# Patient Record
Sex: Female | Born: 1965 | Race: White | Hispanic: No | Marital: Single
Health system: Southern US, Community
[De-identification: ages and names within clinical notes are randomized; demographics above are authoritative.]

---

## 1999-05-28 ENCOUNTER — Emergency Department (HOSPITAL_COMMUNITY): Admission: EM | Admit: 1999-05-28 | Discharge: 1999-05-28 | Payer: Self-pay | Admitting: Emergency Medicine

## 2003-09-21 ENCOUNTER — Ambulatory Visit (HOSPITAL_COMMUNITY): Admission: RE | Admit: 2003-09-21 | Discharge: 2003-09-21 | Payer: Self-pay | Admitting: Internal Medicine

## 2005-07-18 ENCOUNTER — Ambulatory Visit (HOSPITAL_COMMUNITY): Admission: RE | Admit: 2005-07-18 | Discharge: 2005-07-18 | Payer: Self-pay | Admitting: Internal Medicine

## 2006-01-25 ENCOUNTER — Ambulatory Visit (HOSPITAL_COMMUNITY): Admission: RE | Admit: 2006-01-25 | Discharge: 2006-01-25 | Payer: Self-pay | Admitting: Family Medicine

## 2013-03-03 ENCOUNTER — Other Ambulatory Visit (HOSPITAL_COMMUNITY): Payer: Self-pay | Admitting: Internal Medicine

## 2013-03-03 DIAGNOSIS — I739 Peripheral vascular disease, unspecified: Secondary | ICD-10-CM

## 2013-03-03 DIAGNOSIS — G8929 Other chronic pain: Secondary | ICD-10-CM

## 2013-03-05 ENCOUNTER — Ambulatory Visit (HOSPITAL_COMMUNITY): Admission: RE | Admit: 2013-03-05 | Payer: Self-pay | Source: Ambulatory Visit

## 2013-09-03 ENCOUNTER — Other Ambulatory Visit (HOSPITAL_COMMUNITY): Payer: Self-pay | Admitting: Internal Medicine

## 2013-09-03 DIAGNOSIS — Z139 Encounter for screening, unspecified: Secondary | ICD-10-CM

## 2013-09-08 ENCOUNTER — Ambulatory Visit (HOSPITAL_COMMUNITY): Payer: Self-pay

## 2014-07-08 ENCOUNTER — Other Ambulatory Visit (HOSPITAL_COMMUNITY): Payer: Self-pay | Admitting: Internal Medicine

## 2014-07-08 DIAGNOSIS — R109 Unspecified abdominal pain: Secondary | ICD-10-CM

## 2014-07-15 ENCOUNTER — Ambulatory Visit (HOSPITAL_COMMUNITY)
Admission: RE | Admit: 2014-07-15 | Discharge: 2014-07-15 | Disposition: A | Discharge status: Home / Self Care | Payer: Self-pay | Source: Ambulatory Visit | Attending: Internal Medicine | Admitting: Internal Medicine

## 2014-07-15 DIAGNOSIS — R109 Unspecified abdominal pain: Secondary | ICD-10-CM

## 2014-07-15 DIAGNOSIS — K8020 Calculus of gallbladder without cholecystitis without obstruction: Secondary | ICD-10-CM | POA: Insufficient documentation

## 2015-11-18 IMAGING — US US ABDOMEN COMPLETE
1 series · 14 of 25 positions shown · non-contrast
Comparison: None.

CLINICAL DATA: Six months of generalized abdominal pain

EXAM:
ULTRASOUND ABDOMEN COMPLETE

[Series 1: us abdomen complete · 0.15mm/px · 14 of 121 slices shown]
[im 1/121]
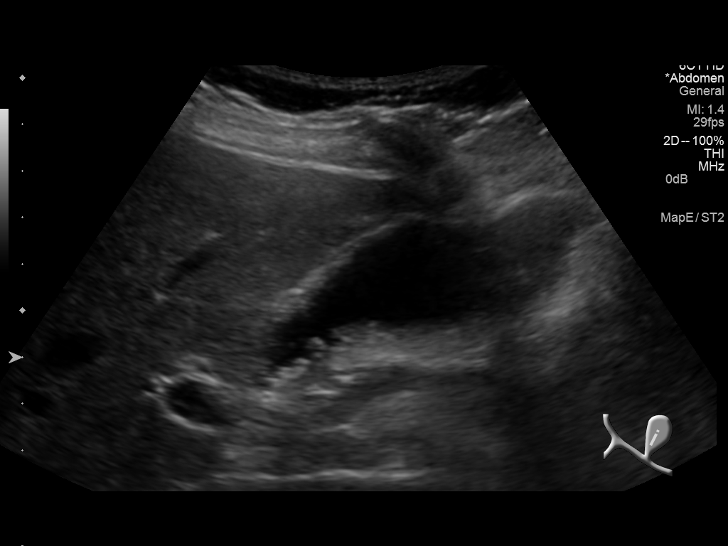
[im 11/121]
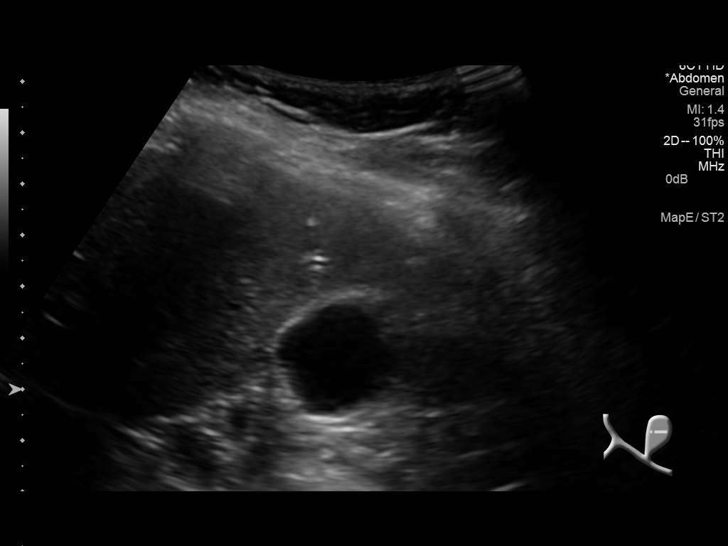
[im 21/121]
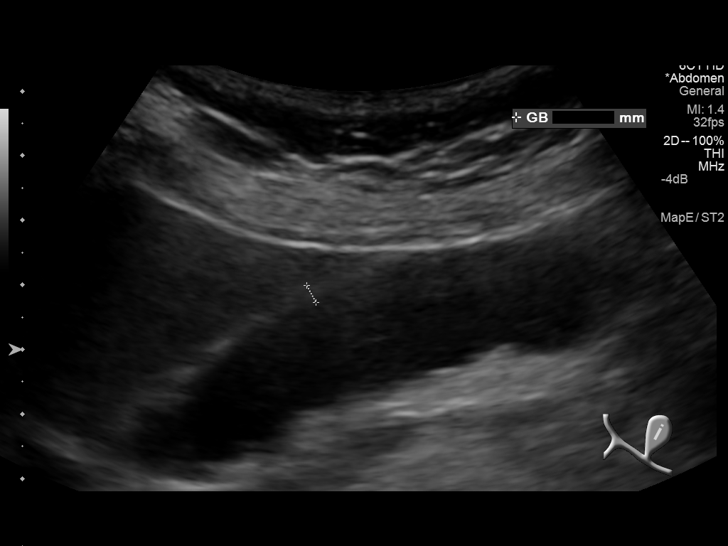
[im 31/121]
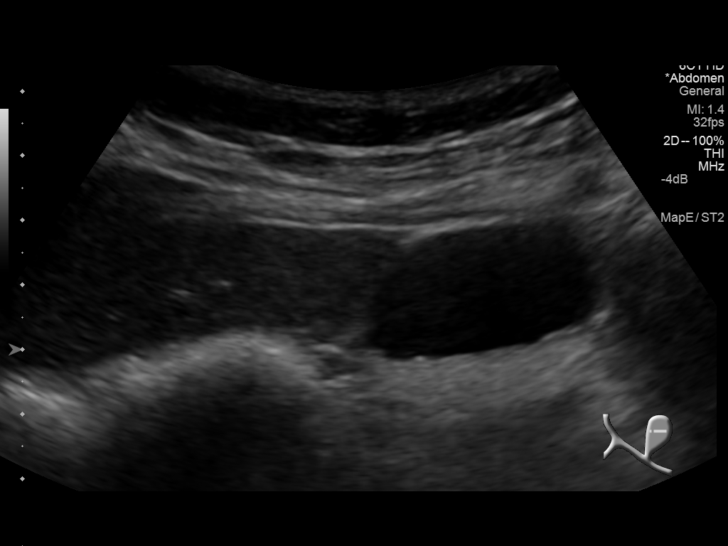
[im 41/121]
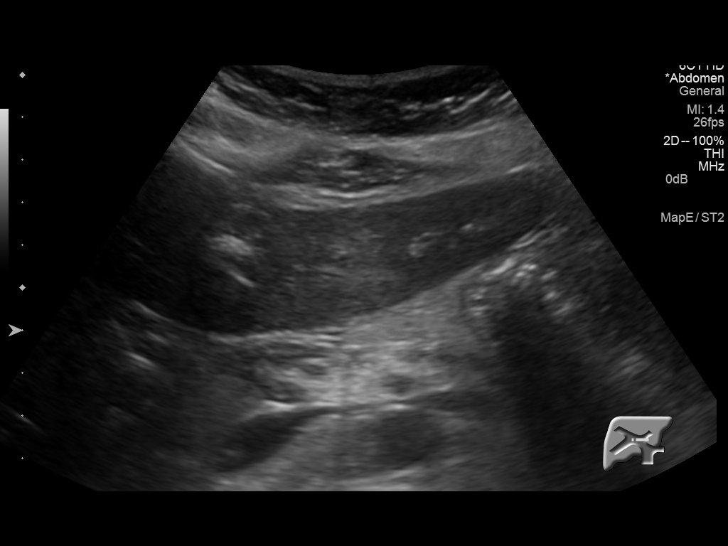
[im 46/121]
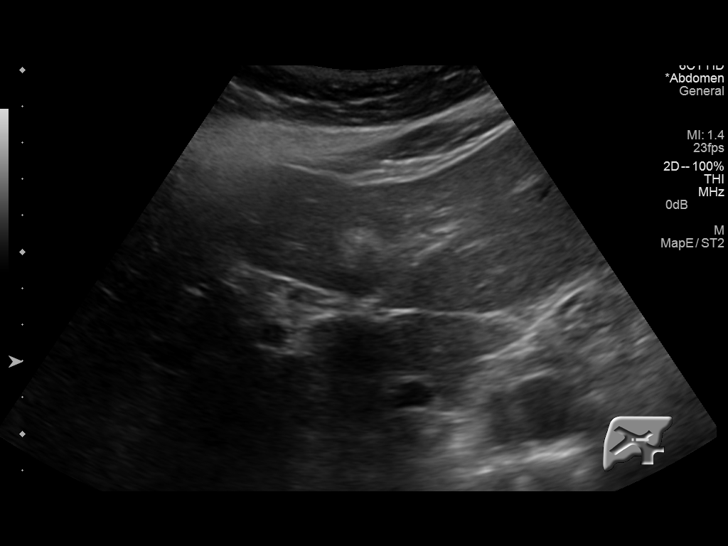
[im 56/121]
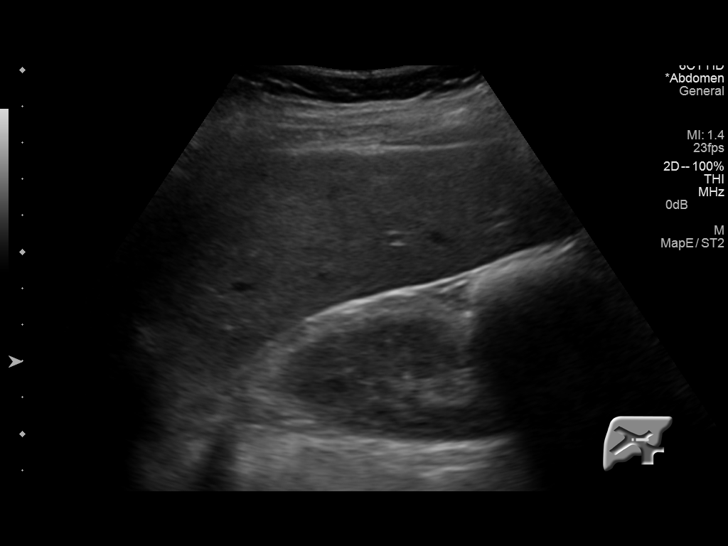
[im 66/121]
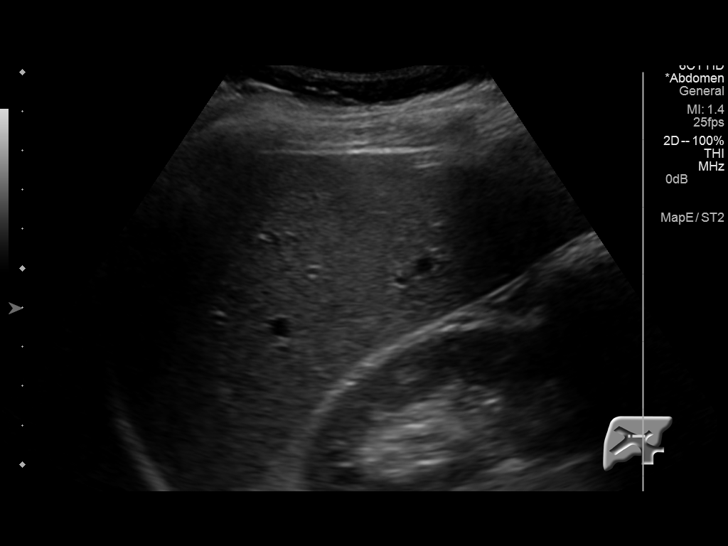
[im 76/121]
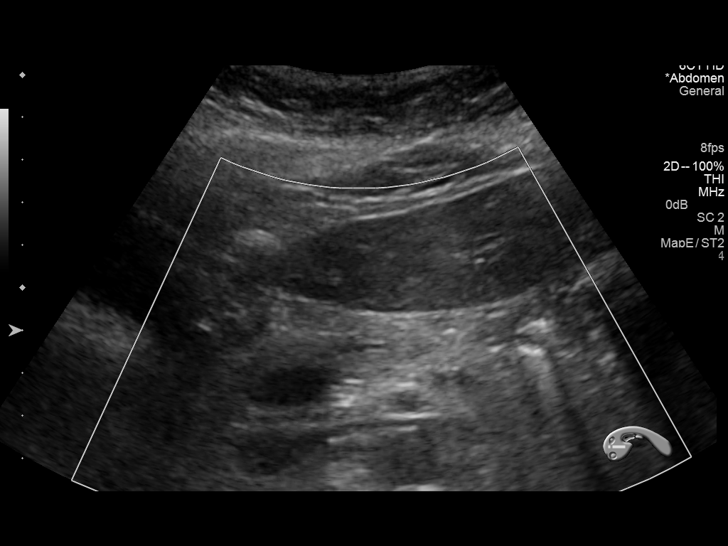
[im 81/121]
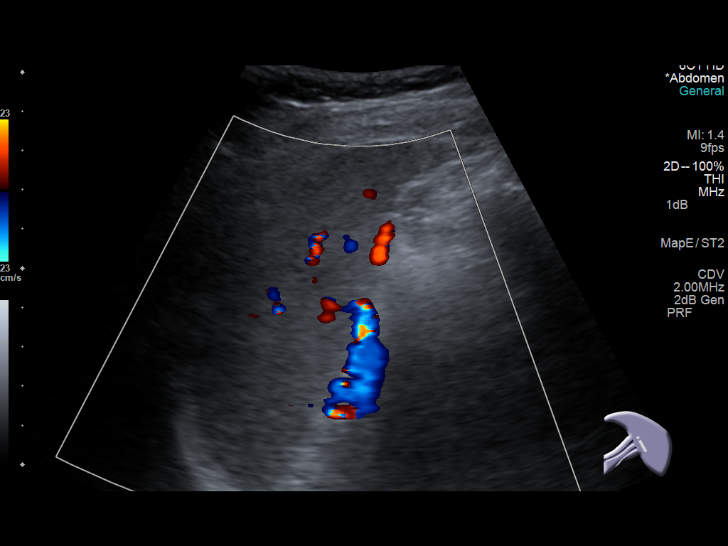
[im 91/121]
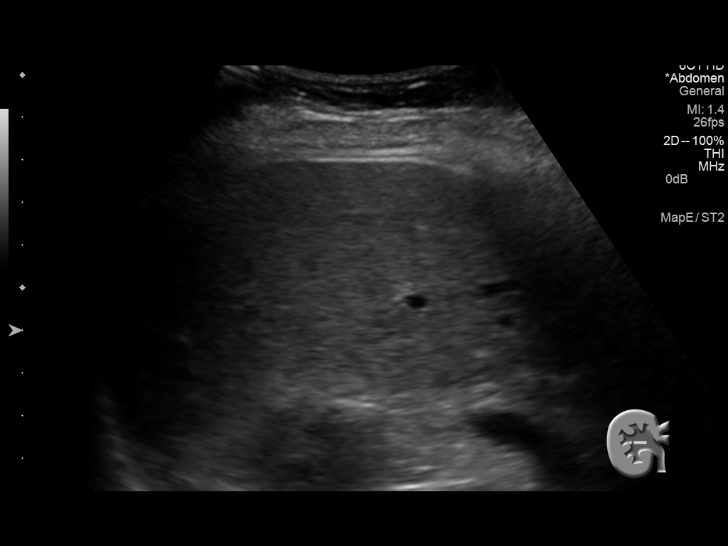
[im 101/121]
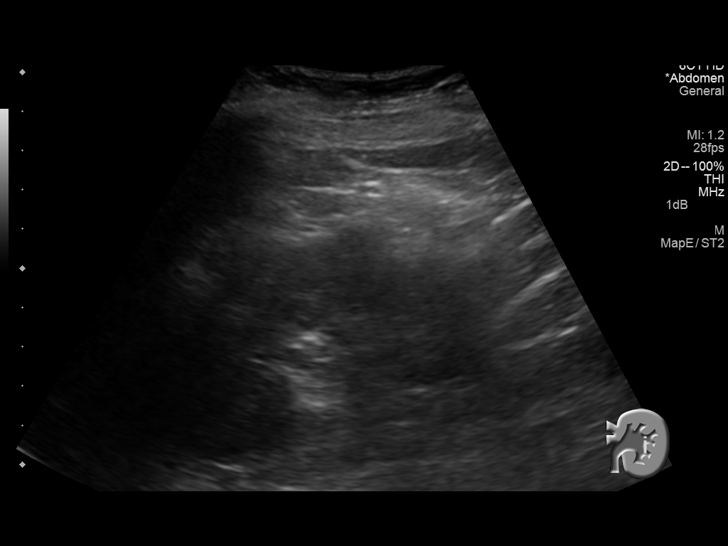
[im 111/121]
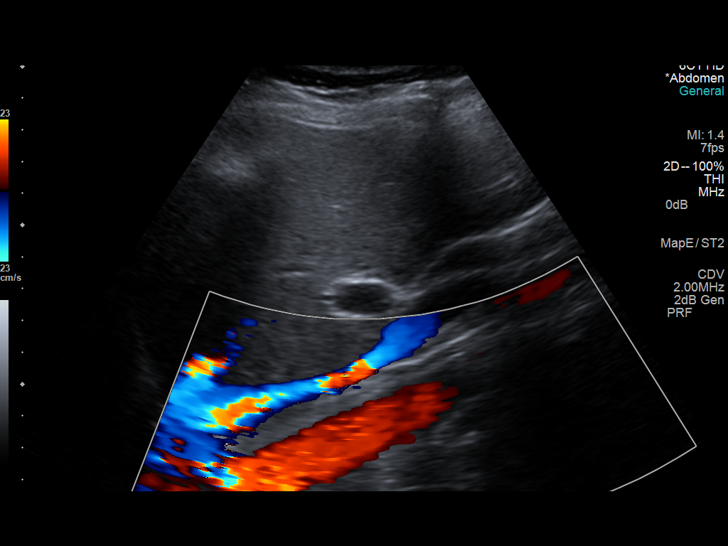
[im 121/121]
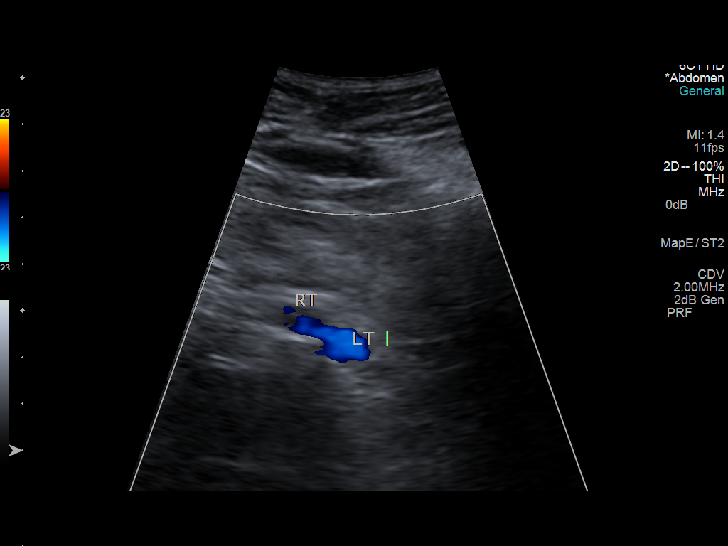

[14 of 25 positions shown; findings below may reference images not displayed]

FINDINGS: Gallbladder: The gallbladder is adequately distended and contains
multiple echogenic mobile shadowing stones. There is borderline wall
thickening at 3 mm but there is no pericholecystic fluid. There is
no positive sonographic Murphy's sign.

Common bile duct: Diameter: 4.5 mm

Liver: No focal lesion identified. Within normal limits in
parenchymal echogenicity.

IVC: No abnormality visualized.

Pancreas: Visualized portion unremarkable.

Spleen: Size and appearance within normal limits.

Right Kidney: Length: 10.4 cm. Echogenicity within normal limits. No
mass or hydronephrosis visualized.

Left Kidney: Length: 10.0 cm. Echogenicity within normal limits. No
mass or hydronephrosis visualized.

Abdominal aorta: No aneurysm visualized.

Other findings: No ascites is demonstrated.
IMPRESSION: There are multiple mobile gallstones with borderline gallbladder
wall thickening but no positive Murphy's sign. Otherwise the
examination is within the limits of normal.

## 2017-07-10 DEATH — deceased
